# Patient Record
Sex: Female | Born: 1980 | Hispanic: Yes | Marital: Single
Health system: Southern US, Community
[De-identification: ages and names within clinical notes are randomized; demographics above are authoritative.]

---

## 2015-08-09 ENCOUNTER — Emergency Department: Payer: No Typology Code available for payment source

## 2015-08-09 ENCOUNTER — Emergency Department
Admission: EM | Admit: 2015-08-09 | Discharge: 2015-08-09 | Disposition: A | Discharge status: Home / Self Care | Payer: No Typology Code available for payment source | Attending: Emergency Medicine | Admitting: Emergency Medicine

## 2015-08-09 ENCOUNTER — Encounter: Payer: Self-pay | Admitting: Emergency Medicine

## 2015-08-09 DIAGNOSIS — S161XXA Strain of muscle, fascia and tendon at neck level, initial encounter: Secondary | ICD-10-CM | POA: Insufficient documentation

## 2015-08-09 DIAGNOSIS — S199XXA Unspecified injury of neck, initial encounter: Secondary | ICD-10-CM | POA: Diagnosis present

## 2015-08-09 DIAGNOSIS — S233XXA Sprain of ligaments of thoracic spine, initial encounter: Secondary | ICD-10-CM

## 2015-08-09 DIAGNOSIS — Z3202 Encounter for pregnancy test, result negative: Secondary | ICD-10-CM | POA: Diagnosis not present

## 2015-08-09 DIAGNOSIS — S29012A Strain of muscle and tendon of back wall of thorax, initial encounter: Secondary | ICD-10-CM | POA: Diagnosis not present

## 2015-08-09 DIAGNOSIS — Y998 Other external cause status: Secondary | ICD-10-CM | POA: Insufficient documentation

## 2015-08-09 DIAGNOSIS — Y92410 Unspecified street and highway as the place of occurrence of the external cause: Secondary | ICD-10-CM | POA: Insufficient documentation

## 2015-08-09 DIAGNOSIS — S239XXA Sprain of unspecified parts of thorax, initial encounter: Secondary | ICD-10-CM | POA: Insufficient documentation

## 2015-08-09 DIAGNOSIS — Y9389 Activity, other specified: Secondary | ICD-10-CM | POA: Insufficient documentation

## 2015-08-09 DIAGNOSIS — S39012A Strain of muscle, fascia and tendon of lower back, initial encounter: Secondary | ICD-10-CM | POA: Insufficient documentation

## 2015-08-09 LAB — POCT PREGNANCY, URINE: PREG TEST UR: NEGATIVE

## 2015-08-09 MED ORDER — HYDROCODONE-ACETAMINOPHEN 5-325 MG PO TABS
1.0000 | ORAL_TABLET | Freq: Once | ORAL | Status: AC
  Administered 2015-08-09: 1 via ORAL
  Filled 2015-08-09: qty 1

## 2015-08-09 MED ORDER — IBUPROFEN 600 MG PO TABS: 600.0000 mg | ORAL_TABLET | Freq: Three times a day (TID) | ORAL | Status: AC | PRN

## 2015-08-09 MED ORDER — HYDROCODONE-ACETAMINOPHEN 5-325 MG PO TABS: 1.0000 | ORAL_TABLET | ORAL | Status: AC | PRN
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
## 2023-02-14 ENCOUNTER — Other Ambulatory Visit: Payer: Self-pay

## 2023-02-14 DIAGNOSIS — R1030 Lower abdominal pain, unspecified: Secondary | ICD-10-CM | POA: Insufficient documentation

## 2023-02-14 DIAGNOSIS — K625 Hemorrhage of anus and rectum: Secondary | ICD-10-CM | POA: Insufficient documentation

## 2023-02-14 LAB — POC URINE PREG, ED: Preg Test, Ur: NEGATIVE

## 2023-02-14 LAB — CBC
HCT: 39.5 % (ref 36.0–46.0)
Hemoglobin: 13.7 g/dL (ref 12.0–15.0)
MCH: 30.5 pg (ref 26.0–34.0)
MCHC: 34.7 g/dL (ref 30.0–36.0)
MCV: 88 fL (ref 80.0–100.0)
Platelets: 340 10*3/uL (ref 150–400)
RBC: 4.49 MIL/uL (ref 3.87–5.11)
RDW: 12.1 % (ref 11.5–15.5)
WBC: 8.9 10*3/uL (ref 4.0–10.5)
nRBC: 0 % (ref 0.0–0.2)

## 2023-02-14 LAB — COMPREHENSIVE METABOLIC PANEL
ALT: 47 U/L — ABNORMAL HIGH (ref 0–44)
AST: 37 U/L (ref 15–41)
Albumin: 4.3 g/dL (ref 3.5–5.0)
Alkaline Phosphatase: 63 U/L (ref 38–126)
Anion gap: 10 (ref 5–15)
BUN: 12 mg/dL (ref 6–20)
CO2: 21 mmol/L — ABNORMAL LOW (ref 22–32)
Calcium: 8.9 mg/dL (ref 8.9–10.3)
Chloride: 102 mmol/L (ref 98–111)
Creatinine, Ser: 0.66 mg/dL (ref 0.44–1.00)
GFR, Estimated: >60 mL/min (ref >60)
Glucose, Bld: 97 mg/dL (ref 70–99)
Potassium: 3.4 mmol/L — ABNORMAL LOW (ref 3.5–5.1)
Sodium: 133 mmol/L — ABNORMAL LOW (ref 135–145)
Total Bilirubin: 0.5 mg/dL (ref 0.3–1.2)
Total Protein: 7.7 g/dL (ref 6.5–8.1)

## 2023-02-14 LAB — TYPE AND SCREEN
ABO/RH(D): O POS
Antibody Screen: NEGATIVE

## 2023-02-14 NOTE — ED Triage Notes (Addendum)
Pt to ED via POV c/o blood in stool since yesterday. Pt reports pain when she has BM but no pain other than that. Denies nausea and vomiting. Reports "a lot of red blood". Denies CP, SOB, fevers, dizziness.

## 2023-02-15 ENCOUNTER — Emergency Department
Admission: EM | Admit: 2023-02-15 | Discharge: 2023-02-15 | Disposition: A | Discharge status: Home / Self Care | Payer: Self-pay | Attending: Emergency Medicine | Admitting: Emergency Medicine

## 2023-02-15 ENCOUNTER — Emergency Department: Payer: Self-pay

## 2023-02-15 DIAGNOSIS — R1030 Lower abdominal pain, unspecified: Secondary | ICD-10-CM

## 2023-02-15 DIAGNOSIS — K625 Hemorrhage of anus and rectum: Secondary | ICD-10-CM

## 2023-02-15 MED ORDER — IOHEXOL 300 MG/ML  SOLN
100.0000 mL | Freq: Once | INTRAMUSCULAR | Status: AC | PRN
  Administered 2023-02-15: 100 mL via INTRAVENOUS

## 2023-02-15 NOTE — Discharge Instructions (Signed)
Return to the ER for worsening symptoms, persistent vomiting, dizziness or other concerns.

## 2023-02-15 NOTE — ED Provider Notes (Signed)
[facility] Provider Note    Event Date/Time   First MD Initiated Contact with Patient 02/15/23 0240     (approximate)   History   Rectal Bleeding   HPI  History obtained via tele-Spanish interpreter  Sue Wagner is a 42 y.o. female who presents to the ED from home with a chief complaint of bloody stools x 3 days.  Patient describes blood mixed in with loose stools intermittently.  Similar episode 6 months ago but did not seek medical evaluation.  Is unable to describe if blood looks bright red or dark red.  No prior history of GI issues.  Not taking anticoagulants.  Endorses lower abdominal cramping.  Denies associated fever/chills, chest pain, shortness of breath, nausea, vomiting or dizziness.  Denies recent travel, trauma or antibiotic use.  Denies excessive use of NSAIDs, aspirin or Goody's powder.  Denies heavy EtOH use.     Past Medical History  History reviewed. No pertinent past medical history.   Active Problem List  There are no problems to display for this patient.    Past Surgical History  History reviewed. No pertinent surgical history.   Home Medications   Prior to Admission medications   Medication Sig Start Date End Date Taking? Authorizing Provider  HYDROcodone-acetaminophen (NORCO/VICODIN) 5-325 MG tablet Take 1 tablet by mouth every 4 (four) hours as needed for moderate pain. 08/09/15   Tommi Rumps, PA-C  ibuprofen (ADVIL,MOTRIN) 600 MG tablet Take 1 tablet (600 mg total) by mouth every 8 (eight) hours as needed. 08/09/15   Tommi Rumps, PA-C     Allergies  Patient has no known allergies.   Family History  History reviewed. No pertinent family history.   Physical Exam  Triage Vital Signs: ED Triage Vitals  Encounter Vitals Group     BP 02/14/23 1925 (!) 140/99     Systolic BP Percentile --      Diastolic BP Percentile --      Pulse Rate 02/14/23 1925 83     Resp 02/14/23 1925 17     Temp  02/14/23 1925 99.3 F (37.4 C)     Temp Source 02/14/23 1925 Oral     SpO2 02/14/23 1925 100 %     Weight 02/14/23 1926 182 lb (82.6 kg)     Height 02/14/23 1926 5\' 4"  (1.626 m)     Head Circumference --      Peak Flow --      Pain Score 02/14/23 1926 0     Pain Loc --      Pain Education --      Exclude from Growth Chart --     Updated Vital Signs: BP 113/79   Pulse (!) 57   Temp 97.7 F (36.5 C)   Resp 17   Ht 5\' 4"  (1.626 m)   Wt 82.6 kg   SpO2 96%   BMI 31.24 kg/m    General: Awake, no distress. CV:  RRR.  Good peripheral perfusion.  Resp:  Normal effort.  CTAB. Abd:  Nontender.  No distention.  Other:  No truncal vesicles.   ED Results / Procedures / Treatments  Labs (all labs ordered are listed, but only abnormal results are displayed) Labs Reviewed  COMPREHENSIVE METABOLIC PANEL - Abnormal; Notable for the following components:      Result Value   Sodium 133 (*)    Potassium 3.4 (*)    CO2 21 (*)    ALT 47 (*)  All other components within normal limits  CBC  URINALYSIS, ROUTINE W REFLEX MICROSCOPIC  POC URINE PREG, ED  POC OCCULT BLOOD, ED  TYPE AND SCREEN     EKG  None   RADIOLOGY I have independently visualized interpreted patient's CT scan as well as noted the radiology interpretation:  CT abdomen/pelvis: Unremarkable  Official radiology report(s): CT ABDOMEN PELVIS W CONTRAST  Result Date: 02/15/2023 CLINICAL DATA:  Abdominal pain. Nonlocalized rectal bleeding. Pain when having BM. EXAM: CT ABDOMEN AND PELVIS WITH CONTRAST TECHNIQUE: Multidetector CT imaging of the abdomen and pelvis was performed using the standard protocol following bolus administration of intravenous contrast. RADIATION DOSE REDUCTION: This exam was performed according to the departmental dose-optimization program which includes automated exposure control, adjustment of the mA and/or kV according to patient size and/or use of iterative reconstruction technique.  CONTRAST:  OMNIPAQUE IOHEXOL 300 MG/ML  SOLN COMPARISON:  None Available. FINDINGS: Lower chest: No acute abnormality. Hepatobiliary: 6 mm low-density structure in the posterior inferior right lobe is too small to characterize. No suspicious enhancing liver lesions. Gallbladder appears normal. No bile duct dilatation. Pancreas: Unremarkable. No pancreatic ductal dilatation or surrounding inflammatory changes. Spleen: Normal in size without focal abnormality. Adrenals/Urinary Tract: Normal adrenal glands. No nephrolithiasis, hydronephrosis or mass. Urinary bladder is unremarkable. Stomach/Bowel: Stomach is within normal limits. Appendix appears normal. No evidence of bowel wall thickening, distention, or inflammatory changes. Vascular/Lymphatic: No significant vascular findings are present. No enlarged abdominal or pelvic lymph nodes. Reproductive: Uterus and bilateral adnexa are unremarkable. Other: No free fluid or fluid collections. Musculoskeletal: No acute or significant osseous findings. IMPRESSION: 1. No acute findings within the abdomen or pelvis. 2. Normal appendix. Electronically Signed   By: Signa Kell M.D.   On: 02/15/2023 05:20     PROCEDURES:  Critical Care performed: No  .1-3 Lead EKG Interpretation  Performed by: Irean Hong, MD Authorized by: Irean Hong, MD     Interpretation: normal     ECG rate:  85   ECG rate assessment: normal     Rhythm: sinus rhythm     Ectopy: none     Conduction: normal   Comments:     Patient placed on cardiac monitor to evaluate for arrhythmias  Rectal exam: Unremarkable external exam, negative guaiac.   MEDICATIONS ORDERED IN ED: Medications  iohexol (OMNIPAQUE) 300 MG/ML solution 100 mL (100 mLs Intravenous Contrast Given 02/15/23 0404)     IMPRESSION / MDM / ASSESSMENT AND PLAN / ED COURSE  I reviewed the triage vital signs and the nursing notes.                             42 year old female presenting with rectal bleeding.  Differential diagnosis includes, but is not limited to, ovarian cyst, ovarian torsion, acute appendicitis, diverticulitis, urinary tract infection/pyelonephritis, etc. I have personally reviewed patient's records and note only 1 other ED visit from 2017 for cervical strain.  Patient's presentation is most consistent with acute presentation with potential threat to life or bodily function.  The patient is on the cardiac monitor to evaluate for evidence of arrhythmia and/or significant heart rate changes.  Laboratory results demonstrate normal hemoglobin 13.7, unremarkable electrolytes.  Patient is not tachycardic and remains hemodynamically stable.  Given she has had several episodes of rectal bleeding with abdominal cramps, will obtain CT abdomen/pelvis to evaluate etiology of patient's symptoms.  Will reassess.  Clinical Course as of 02/15/23  7829  Fri Feb 15, 2023  5621 CT unremarkable.  Patient remains hemodynamically stable.  Will refer to GI for outpatient follow-up.  Strict return precautions given.  Patient verbalizes understanding and agrees with plan of care. [JS]    Clinical Course User Index [JS] Irean Hong, MD     FINAL CLINICAL IMPRESSION(S) / ED DIAGNOSES   Final diagnoses:  Rectal bleeding  Lower abdominal pain     Rx / DC Orders   ED Discharge Orders     None        Note:  This document was prepared using Dragon voice recognition software and may include unintentional dictation errors.   Irean Hong, MD 02/15/23 [phone]
